# Patient Record
Sex: Male | Born: 1960 | Race: Black or African American | Hispanic: No | Marital: Single
Health system: Southern US, Community
[De-identification: ages and names within clinical notes are randomized; demographics above are authoritative.]

---

## 2020-09-02 ENCOUNTER — Other Ambulatory Visit: Payer: Self-pay

## 2020-09-02 ENCOUNTER — Emergency Department (HOSPITAL_COMMUNITY): Payer: Medicaid - Out of State

## 2020-09-02 ENCOUNTER — Emergency Department (HOSPITAL_COMMUNITY)
Admission: EM | Admit: 2020-09-02 | Discharge: 2020-09-02 | Disposition: A | Discharge status: Home / Self Care | Payer: Medicaid - Out of State | Attending: Emergency Medicine | Admitting: Emergency Medicine

## 2020-09-02 DIAGNOSIS — M25512 Pain in left shoulder: Secondary | ICD-10-CM | POA: Insufficient documentation

## 2020-09-02 DIAGNOSIS — M549 Dorsalgia, unspecified: Secondary | ICD-10-CM | POA: Insufficient documentation

## 2020-09-02 DIAGNOSIS — R001 Bradycardia, unspecified: Secondary | ICD-10-CM | POA: Insufficient documentation

## 2020-09-02 LAB — BASIC METABOLIC PANEL
Anion gap: 8 (ref 5–15)
BUN: 16 mg/dL (ref 8–23)
CO2: 31 mmol/L (ref 22–32)
Calcium: 9.7 mg/dL (ref 8.9–10.3)
Chloride: 101 mmol/L (ref 98–111)
Creatinine, Ser: 1.01 mg/dL (ref 0.61–1.24)
GFR, Estimated: >60 mL/min (ref >60)
Glucose, Bld: 96 mg/dL (ref 70–99)
Potassium: 4 mmol/L (ref 3.5–5.1)
Sodium: 140 mmol/L (ref 135–145)

## 2020-09-02 LAB — CBC WITH DIFFERENTIAL/PLATELET
Abs Immature Granulocytes: 0.02 10*3/uL (ref 0.00–0.07)
Basophils Absolute: 0.1 10*3/uL (ref 0.0–0.1)
Basophils Relative: 1 %
Eosinophils Absolute: 0.2 10*3/uL (ref 0.0–0.5)
Eosinophils Relative: 2 %
HCT: 39.9 % (ref 39.0–52.0)
Hemoglobin: 12.6 g/dL — ABNORMAL LOW (ref 13.0–17.0)
Immature Granulocytes: 0 %
Lymphocytes Relative: 22 %
Lymphs Abs: 1.6 10*3/uL (ref 0.7–4.0)
MCH: 28.6 pg (ref 26.0–34.0)
MCHC: 31.6 g/dL (ref 30.0–36.0)
MCV: 90.5 fL (ref 80.0–100.0)
Monocytes Absolute: 0.5 10*3/uL (ref 0.1–1.0)
Monocytes Relative: 7 %
Neutro Abs: 4.8 10*3/uL (ref 1.7–7.7)
Neutrophils Relative %: 68 %
Platelets: 227 10*3/uL (ref 150–400)
RBC: 4.41 MIL/uL (ref 4.22–5.81)
RDW: 11.8 % (ref 11.5–15.5)
WBC: 7 10*3/uL (ref 4.0–10.5)
nRBC: 0 % (ref 0.0–0.2)

## 2020-09-02 MED ORDER — GADOBUTROL 1 MMOL/ML IV SOLN
8.0000 mL | Freq: Once | INTRAVENOUS | Status: AC | PRN
  Administered 2020-09-02: 8 mL via INTRAVENOUS

## 2020-09-02 MED ORDER — LIDOCAINE 5 % EX PTCH
1.0000 | MEDICATED_PATCH | Freq: Once | CUTANEOUS | Status: DC
  Administered 2020-09-02: 1 via TRANSDERMAL
  Filled 2020-09-02: qty 1

## 2020-09-02 NOTE — Discharge Instructions (Addendum)
The imaging today did not show any abnormalities in your back.   The xrays show arthritis. You should follow up with orthopedics about your back pain. Your MRI report will be available on your MyChart.  Return to the emergency department if you have any new or worsening symptoms. Follow-up with a primary care doctor for your slow heart rate.  Return if you develop lightheadedness dizziness or chest pain  Thank you for allowing [place] to care for you today.

## 2020-09-02 NOTE — ED Triage Notes (Signed)
Patient reports pain in left shoulder joint starting one week ago. Denies injury. Pain is 8/10. Patient says he also has pain in back and points to lower right side of back. States there are "lumps on back" and causing him to walk and lean to opposite side. Pain in back rated 7/10. Pain present for 1 year or more.

## 2020-09-02 NOTE — ED Provider Notes (Signed)
[facility]-EMERGENCY DEPT Provider Note   CSN: [id number] Arrival date & time: 09/02/20  3762     History Chief Complaint  Patient presents with  . left shoulder pain  . Back Pain    Darrell Castro is a 59 y.o. male with medical history significant for tobacco abuse.  HPI Patient presents to emergency department today with chief complaint of left shoulder pain and back pain.  He states his left shoulder pain has been present x1 week.  He denies any known injury.  He states the pain is a twisting sensation.  He states it happens randomly and is not brought on by activity or certain movements.  He states he is not having the pain right now.  The pain does not radiate.  He rates the pain 8/10 in severity.  He has not tried any medications for his pain prior to arrival.   Patient also states he has a lump on his back that causes him to walk and lean to the left.  He states it has been there for a year.  He has not had it evaluated before.  He states it is painful and describes the pain as a aching sensation.  The pain does not radiate.  He denies any fatigue, weight loss, night sweats, fever, chills, neck pain, shortness of breath, chest pain, saddle anesthesia, numbness, weakness, tingling.     No past medical history on file.  There are no problems to display for this patient.   No family history on file.  Social History   Tobacco Use  . Smoking status: Not on file  Substance Use Topics  . Alcohol use: Not on file  . Drug use: Not on file    Home Medications Prior to Admission medications   Not on File    Allergies    Patient has no known allergies.  Review of Systems   Review of Systems All other systems are reviewed and are negative for acute change except as noted in the HPI.  Physical Exam Updated Vital Signs BP (!) 148/94 (BP Location: Left Arm)   Pulse 72   Temp 98.8 F (37.1 C) (Oral)   Resp 16   Ht 6' (1.829 m)   Wt 81.6 kg    SpO2 97%   BMI 24.41 kg/m   Physical Exam Vitals and nursing note reviewed.  Constitutional:      General: He is not in acute distress.    Appearance: He is not ill-appearing.  HENT:     Head: Normocephalic and atraumatic.     Right Ear: Tympanic membrane and external ear normal.     Left Ear: Tympanic membrane and external ear normal.     Nose: Nose normal.     Mouth/Throat:     Mouth: Mucous membranes are moist.     Pharynx: Oropharynx is clear.  Eyes:     General: No scleral icterus.       Right eye: No discharge.        Left eye: No discharge.     Extraocular Movements: Extraocular movements intact.     Conjunctiva/sclera: Conjunctivae normal.     Pupils: Pupils are equal, round, and reactive to light.  Neck:     Vascular: No JVD.     Comments: Full ROM intact without spinous process TTP. No bony stepoffs or deformities, no paraspinous muscle TTP or muscle spasms. No rigidity or meningeal signs. No bruising, erythema, or swelling.  Cardiovascular:  Rate and Rhythm: Normal rate and regular rhythm.     Pulses: Normal pulses.          Radial pulses are 2+ on the right side and 2+ on the left side.     Heart sounds: Normal heart sounds.  Pulmonary:     Comments: Lungs clear to auscultation in all fields. Symmetric chest rise. No wheezing, rales, or rhonchi. Abdominal:     Comments: Abdomen is soft, non-distended, and non-tender in all quadrants. No rigidity, no guarding. No peritoneal signs.  Musculoskeletal:        General: Normal range of motion.       Arms:     Cervical back: Normal range of motion.     Comments: leftshoulder with bony tenderness. Full ROM. Negative  empty can test, negative Neer's, no Lift off. no swelling, erythema or ecchymosis present. No step-off, crepitus, or deformity appreciated. 5/5 muscle strength of bilateral UE. 2+ radial pulse, sensation intact and all compartments soft.   Skin:    General: Skin is warm and dry.     Capillary Refill:  Capillary refill takes less than 2 seconds.  Neurological:     Mental Status: He is oriented to person, place, and time.     GCS: GCS eye subscore is 4. GCS verbal subscore is 5. GCS motor subscore is 6.     Comments: Fluent speech, no facial droop.  Sensation grossly intact to light touch in the lower extremities bilaterally. No saddle anesthesias. Strength 5/5 with flexion and extension at the bilateral hips, knees, and ankles. No noted gait deficit. Coordination intact with heel to shin testing.   Psychiatric:        Behavior: Behavior normal.     ED Results / Procedures / Treatments   Labs (all labs ordered are listed, but only abnormal results are displayed) Labs Reviewed  CBC WITH DIFFERENTIAL/PLATELET - Abnormal; Notable for the following components:      Result Value   Hemoglobin 12.6 (*)    All other components within normal limits  BASIC METABOLIC PANEL    EKG EKG Interpretation  Date/Time:  Saturday September 02 2020 16:08:58 EST Ventricular Rate:  43 PR Interval:    QRS Duration: 86 QT Interval:  499 QTC Calculation: 422 R Axis:   71 Text Interpretation: Sinus or ectopic atrial bradycardia Abnormal R-wave progression, late transition No old tracing to compare Confirmed by Benjiman CorePickering, Nathan [phone]([id number]) on 09/02/2020 4:27:43 PM   Radiology DG Thoracic Spine 2 View  Result Date: 09/02/2020 CLINICAL DATA:  Dorsalgia EXAM: THORACIC SPINE 2 VIEWS COMPARISON:  None. FINDINGS: Frontal and lateral views were obtained. There is no fracture or spondylolisthesis. There is disc space narrowing at several levels. No erosive change or paraspinous lesion. Visualized lungs clear. There is aortic atherosclerosis. IMPRESSION: Disc space narrowing at several levels consistent with a degree of osteoarthritis. No fracture or spondylolisthesis. Aortic Atherosclerosis (ICD10-I70.0). Electronically Signed   By: Bretta BangWilliam  Woodruff III M.D.   On: 09/02/2020 10:54   DG Lumbar Spine  Complete  Result Date: 09/02/2020 CLINICAL DATA:  Low back pain EXAM: LUMBAR SPINE - COMPLETE 4+ VIEW COMPARISON:  None. FINDINGS: Frontal, lateral, spot lumbosacral lateral, and bilateral oblique views were obtained. There are 5 non-rib-bearing lumbar type vertebral bodies. There is upper lumbar dextroscoliosis with rotatory component. There is no fracture or spondylolisthesis. There is moderately severe disc space narrowing at L3-4, L4-5, and L5-S1 with milder disc space narrowing L1-2 and L2-3. There is facet osteoarthritic change  at L4-5 and L5-S1 bilaterally as well as to a slightly lesser extent at L3-4 bilaterally. No erosion. IMPRESSION: Scoliosis. Multilevel arthropathy, most notably at L3-4, L4-5, L5-S1. No fracture or spondylolisthesis. Electronically Signed   By: Bretta Bang III M.D.   On: 09/02/2020 10:56   MR THORACIC SPINE W WO CONTRAST  Result Date: 09/02/2020 CLINICAL DATA:  Back pain. We are asked to evaluate for a paraspinal mass. EXAM: MRI THORACIC WITHOUT AND WITH CONTRAST TECHNIQUE: Multiplanar and multiecho pulse sequences of the thoracic spine were obtained without and with intravenous contrast. CONTRAST:  50mL GADAVIST GADOBUTROL 1 MMOL/ML IV SOLN COMPARISON:  Radiographs 09/02/2020 FINDINGS: Despite efforts by the technologist and patient, motion artifact is present on today's exam and could not be eliminated. This reduces exam sensitivity and specificity. MRI THORACIC SPINE FINDINGS Alignment:  No vertebral subluxation is observed. Vertebrae: Small hemangioma along the superior endplate of T6. No significant abnormal vertebral edema or enhancement. Cord:  Unremarkable Paraspinal and other soft tissues: Subject to the limitations due to the substantial motion artifact, no paraspinal mass is identified. Disc levels: No significant impingement in the thoracic spine. Multilevel spondylosis and degenerative disc disease noted as follows: T1-2: Shallow left paracentral disc  protrusion. T2-3: Diffuse disc bulge. T3-4: Small central disc protrusion. T4-5: Unremarkable. T5-6: Right paracentral disc protrusion thins the ventral subarachnoid space. T6-7: Small central disc protrusion. T7-8: Small central disc protrusion. T8-9: Minimal disc bulge. T9-10: Minimal disc bulge. T10-11: Mild disc bulge. T11-12: Diffuse disc bulge. T12-L1: Mild disc bulge with shallow left paracentral disc protrusion. IMPRESSION: 1. Subject to the limitations due to motion artifact, no paraspinal mass is identified. 2. 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 3. Despite efforts by the technologist and patient, motion artifact is present on today's exam and could not be eliminated. This reduces exam sensitivity and specificity. Electronically Signed   By: Gaylyn Rong M.D.   On: 09/02/2020 14:45   DG Shoulder Left  Result Date: 09/02/2020 CLINICAL DATA:  Pain EXAM: LEFT SHOULDER - 2+ VIEW COMPARISON:  None. FINDINGS: Oblique, Y scapular, and axillary images were obtained. No fracture or dislocation. There is slight narrowing in the acromioclavicular joint. Glenohumeral joint appears normal. No erosive change or intra-articular calcification. Visualized left lung clear. IMPRESSION: Mild narrowing acromioclavicular joint. Glenohumeral joint appears normal. No fracture or dislocation. No erosion. Electronically Signed   By: Bretta Bang III M.D.   On: 09/02/2020 10:53    Procedures Procedures (including critical care time)  Medications Ordered in ED Medications  lidocaine (LIDODERM) 5 % 1 patch (1 patch Transdermal Patch Applied 09/02/20 1708)  gadobutrol (GADAVIST) 1 MMOL/ML injection 8 mL (8 mLs Intravenous Contrast Given 09/02/20 1435)    ED Course  I have reviewed the triage vital signs and the nursing notes.  Pertinent labs & imaging results that were available during my care of the patient were reviewed by me and considered in my  medical decision making (see chart for details).    MDM Rules/Calculators/A&P                          History provided by patient with additional history obtained from chart review.    59 yo male presenting with left shoulder pain x 1 week and right back pain x 1 year. He is well appearing, in no acute distress. He has bony tenderness to left shoulder, no deformity. No exam findings to suggest septic arthritis  or infection. He has a large 14x7 cm rectangular area of mass in soft tissue on right side of thoracic spine. No midline tenderness, no deformity or step off.  I viewed all xray images.  X-ray of lumbar and thoracic spine shows scoliosis, multilevel arthropathy x-ray of left shoulder shows mild narrowing of the AC joint, no fracture or dislocation.  Soft tissue mass on back with unclear etiology.  Possible lipoma, possible lucency.  Patient does not have outpatient follow-up.  Discussed with ED attending Dr. Estell Harpin who personal evaluate patient as well.  Plan to MRI thoracic spine giving the deficits this is been causing for patient. Basic labs checked and are overall unremarkable.  MRI thoracic obtained and is unremarkable. No abscess or tumor to suggest malignancy. Discussed results with patient and daughter at the bedside. Recommend patient follow up with orthopedics. He is only here visiting from [place]. Will give local ortho follow up incase he stays in [place] or advise him to see ortho if he returns home.   At discharge he was noted to be bradycardic. He was evaluated by ED attending and EKG obtained. It showed sinus brady. Patient admits to history of the same. He was asymptomatic and discharged home.   Final Clinical Impression(s) / ED Diagnoses Final diagnoses:  Right-sided back pain, unspecified back location, unspecified chronicity  Bradycardia    Rx / DC Orders ED Discharge Orders    None       Kandice Hams 09/02/20 1725    Bethann Berkshire, MD 09/03/20  [phone]

## 2020-09-02 NOTE — ED Notes (Signed)
Pt in bed watching tv, family at bedside, respirations even and unlabored. No s/s of distress. Will continue to monitor.

## 2020-09-02 NOTE — ED Provider Notes (Signed)
  Physical Exam  BP (!) 153/84   Pulse (!) 45   Temp 98.8 F (37.1 C) (Oral)   Resp 15   Ht 6' (1.829 m)   Wt 81.6 kg   SpO2 99%   BMI 24.41 kg/m   Physical Exam  ED Course/Procedures     Procedures  MDM  Received patient in signout.  Bradycardia.  Reported history of same.  Patient states he has been told his heart rate since low before.  No lightheadedness or dizziness.  Not having any exertional chest pain.  Doubt the bradycardia is an anginal equivalent.  EKG shows a sinus bradycardia.  Do not think we need troponin or further work-up at this time.  Outpatient follow-up as needed.       Benjiman Core, MD 09/02/20 [phone]

## 2021-08-06 IMAGING — MR MR THORACIC SPINE WO/W CM
9 of 11 series · 31 of 48 positions shown · IV contrast (with contrast)
Comparison: Radiographs 09/02/2020

CLINICAL DATA: Back pain. We are asked to evaluate for a paraspinal
mass.

EXAM:
MRI THORACIC WITHOUT AND WITH CONTRAST
TECHNIQUE: Multiplanar and multiecho pulse sequences of the thoracic spine were
obtained without and with intravenous contrast.
CONTRAST:  8mL GADAVIST GADOBUTROL 1 MMOL/ML IV SOLN

[Series 16: T1 · sagittal · 4.0mm · 1.72mm/px · 1 of 5 slices shown (1 of 4)]
[im 1/5]
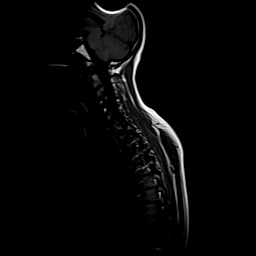

[Series 17: STIR · sagittal · 3.0mm · 1.03mm/px · 2 of 15 slices shown]
[im 1/15]
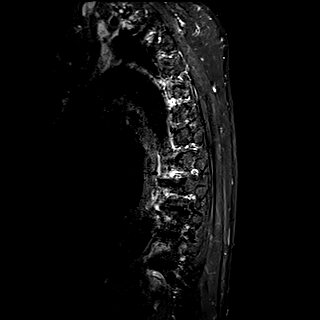
[im 15/15]
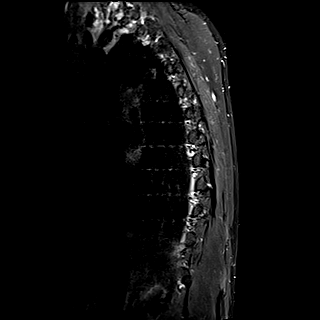

[Series 18: T1 · sagittal · 3.0mm · 1.00mm/px · 2 of 15 slices shown (2 of 4)]
[im 1/15]
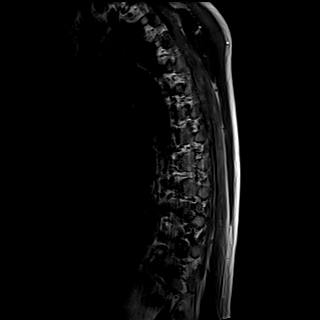
[im 15/15]
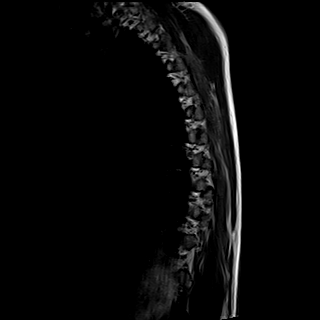

[Series 19: T1 · sagittal · 3.0mm · 1.00mm/px · 2 of 15 slices shown (3 of 4)]
[im 1/15]
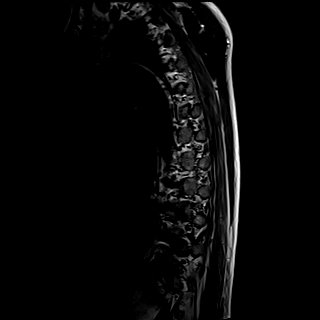
[im 15/15]
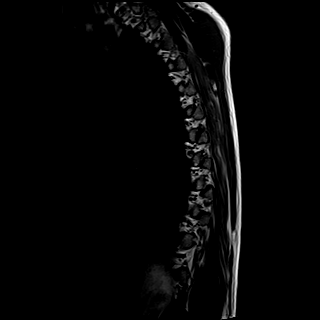

[Series 20: T2 · axial · 4.0mm · 0.78mm/px · z∈[-280,-71]mm · 7 of 39 slices shown]
[im 1/39]
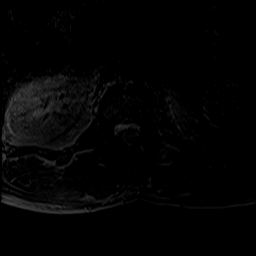
[im 7/39]
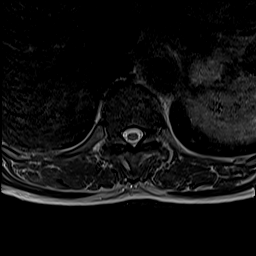
[im 13/39]
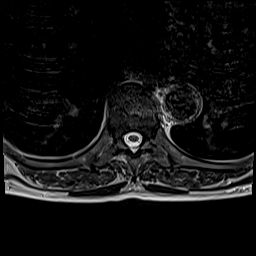
[im 20/39]
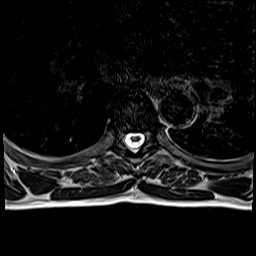
[im 26/39]
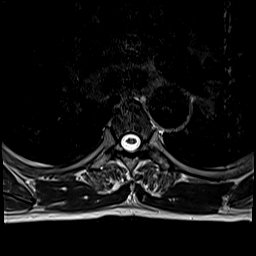
[im 32/39]
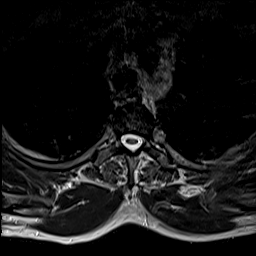
[im 39/39]
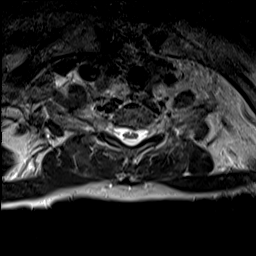

[Series 22: T1 · axial · 4.0mm · 0.39mm/px · z∈[-280,-71]mm · 7 of 39 slices shown (4 of 4)]
[im 1/39]
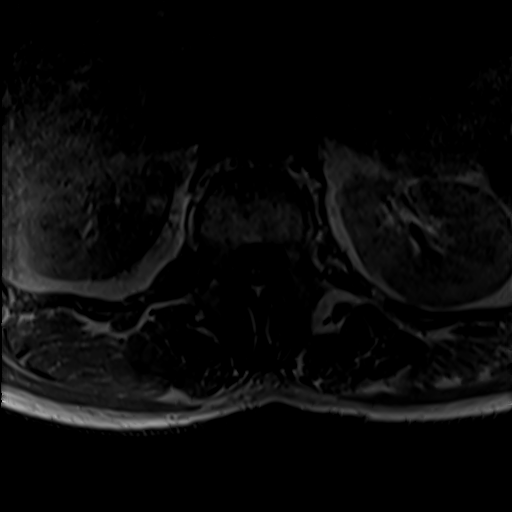
[im 7/39]
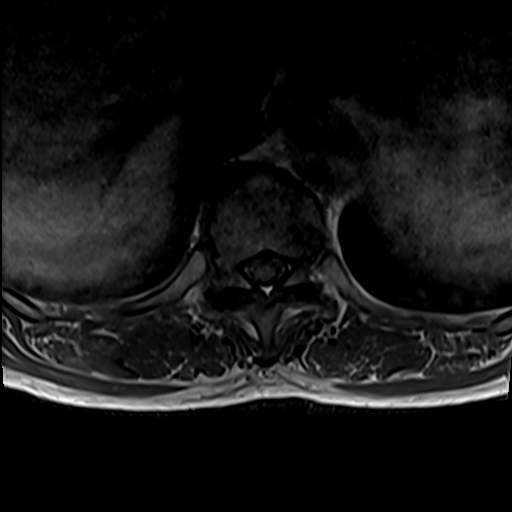
[im 13/39]
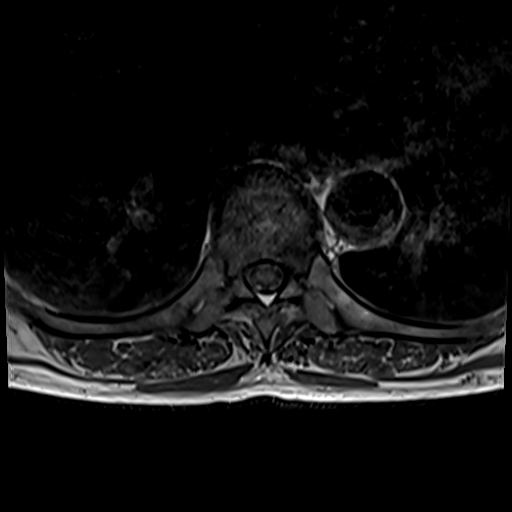
[im 20/39]
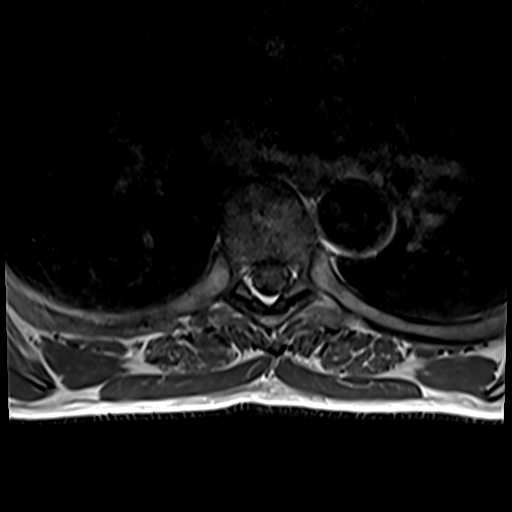
[im 26/39]
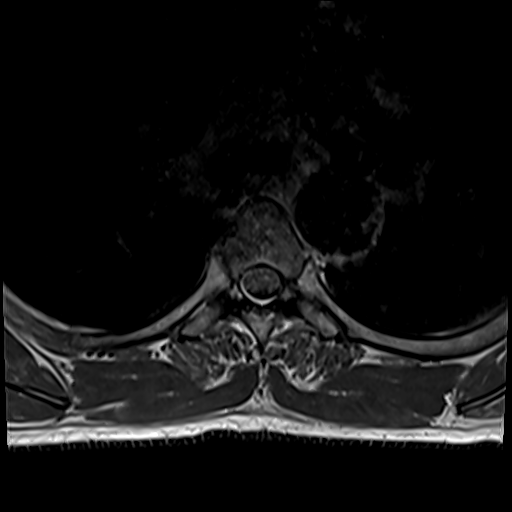
[im 32/39]
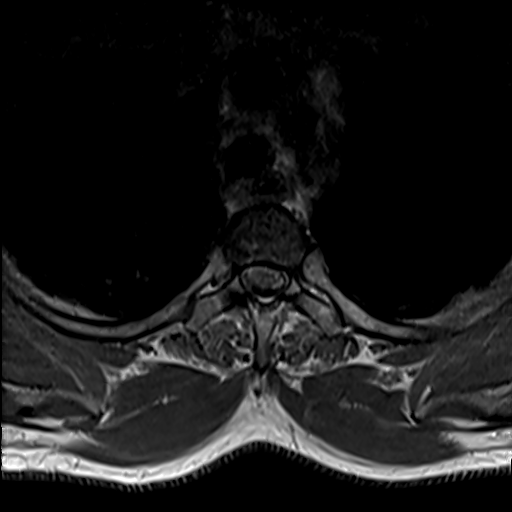
[im 39/39]
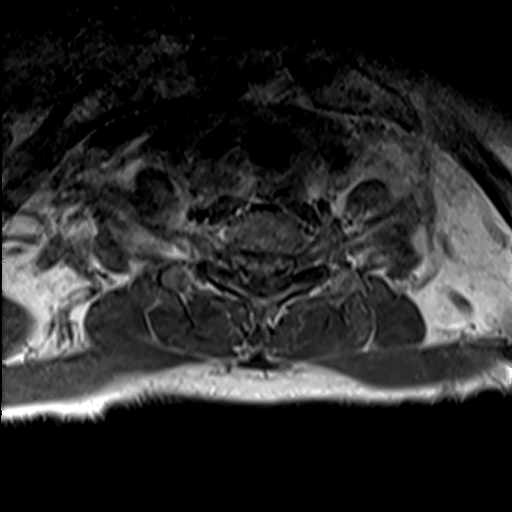

[Series 23: T2 post-contrast · sagittal · 3.0mm · 0.86mm/px · 3 of 15 slices shown]
[im 1/15]
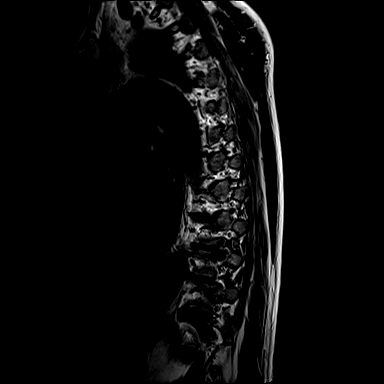
[im 8/15]
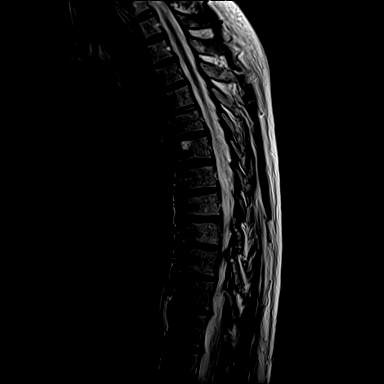
[im 15/15]
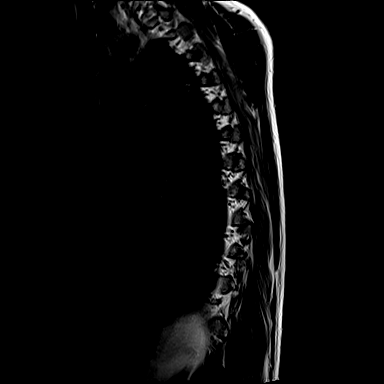

[Series 24: T1 fat-sat post-contrast · sagittal · 3.0mm · 1.03mm/px · 3 of 15 slices shown]
[im 1/15]
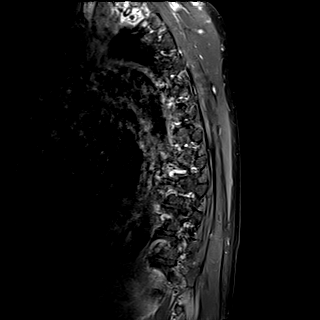
[im 8/15]
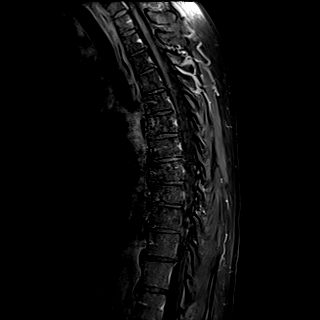
[im 15/15]
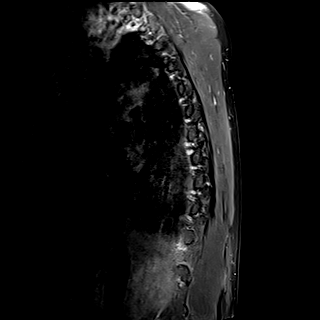

[Series 25: T1 post-contrast · axial · 4.0mm · 0.39mm/px · z∈[-280,-152]mm · 4 of 39 slices shown]
[im 1/39]
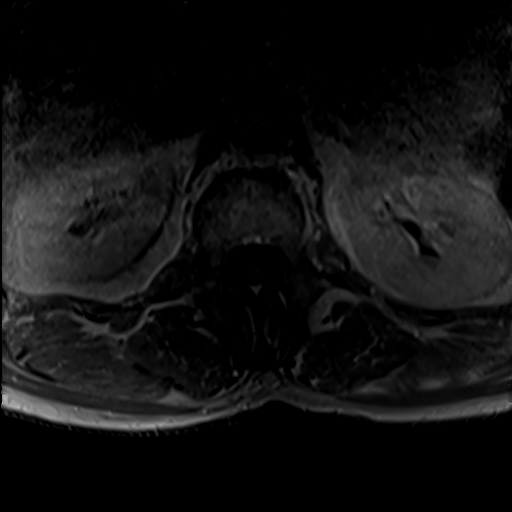
[im 7/39]
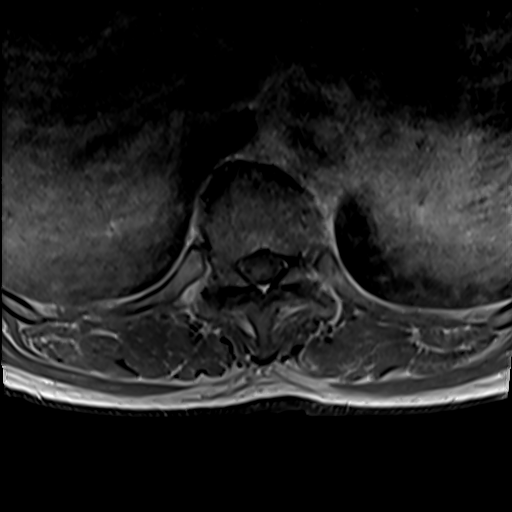
[im 13/39]
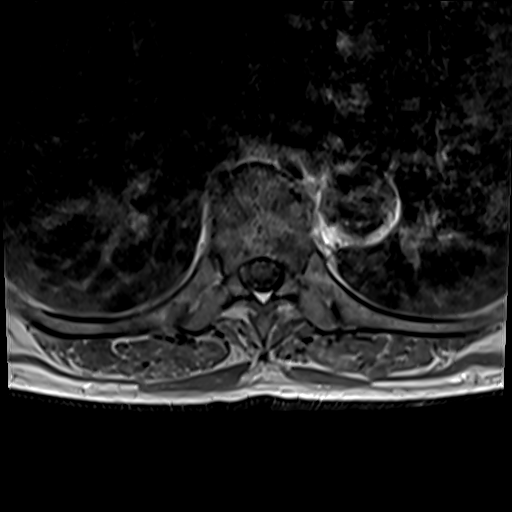
[im 20/39]
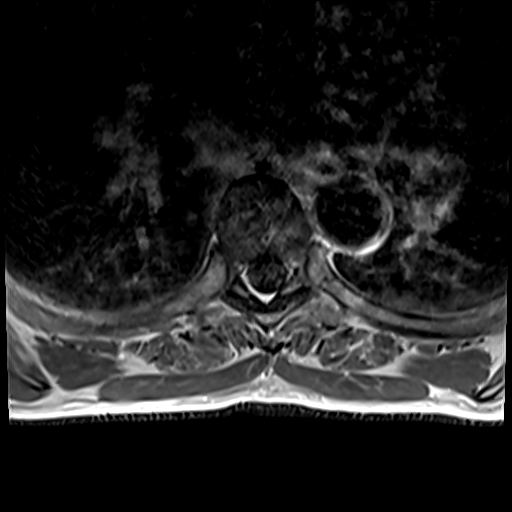

[31 of 48 positions shown; findings below may reference images not displayed]

FINDINGS: Despite efforts by the technologist and patient, motion artifact is
present on today's exam and could not be eliminated. This reduces
exam sensitivity and specificity.

MRI THORACIC SPINE FINDINGS

Alignment:  No vertebral subluxation is observed.

Vertebrae: Small hemangioma along the superior endplate of T6. No
significant abnormal vertebral edema or enhancement.

Cord:  Unremarkable

Paraspinal and other soft tissues: Subject to the limitations due to
the substantial motion artifact, no paraspinal mass is identified.

Disc levels:

No significant impingement in the thoracic spine. Multilevel
spondylosis and degenerative disc disease noted as follows:

T1-2: Shallow left paracentral disc protrusion.

T2-3: Diffuse disc bulge.

T3-4: Small central disc protrusion.

T4-5: Unremarkable.

T5-6: Right paracentral disc protrusion thins the ventral
subarachnoid space.

T6-7: Small central disc protrusion.

T7-8: Small central disc protrusion.

T8-9: Minimal disc bulge.

T9-10: Minimal disc bulge.

T10-11: Mild disc bulge.

T11-12: Diffuse disc bulge.

T12-L1: Mild disc bulge with shallow left paracentral disc
protrusion.
IMPRESSION: 1. Subject to the limitations due to motion artifact, no paraspinal
mass is identified.
2. Multilevel degenerative disc disease in the thoracic spine,
without significant impingement.
3. Despite efforts by the technologist and patient, motion artifact
is present on today's exam and could not be eliminated. This reduces
exam sensitivity and specificity.
